# Patient Record
Sex: Female | Born: 1990
Health system: Southern US, Community
[De-identification: ages and names within clinical notes are randomized; demographics above are authoritative.]

---

## 2009-04-26 ENCOUNTER — Emergency Department (HOSPITAL_COMMUNITY): Admission: EM | Admit: 2009-04-26 | Discharge: 2009-04-26 | Payer: Self-pay | Admitting: Emergency Medicine

## 2010-01-30 ENCOUNTER — Emergency Department (HOSPITAL_COMMUNITY): Admission: EM | Admit: 2010-01-30 | Discharge: 2010-01-31 | Payer: Self-pay | Admitting: Emergency Medicine

## 2011-02-24 ENCOUNTER — Emergency Department (HOSPITAL_COMMUNITY)
Admission: EM | Admit: 2011-02-24 | Discharge: 2011-02-25 | Disposition: A | Discharge status: Home / Self Care | Payer: Self-pay | Attending: Emergency Medicine | Admitting: Emergency Medicine

## 2011-02-24 DIAGNOSIS — R21 Rash and other nonspecific skin eruption: Secondary | ICD-10-CM | POA: Insufficient documentation

## 2011-02-24 DIAGNOSIS — R109 Unspecified abdominal pain: Secondary | ICD-10-CM | POA: Insufficient documentation

## 2011-02-25 LAB — DIFFERENTIAL
Eosinophils Absolute: 0.1 10*3/uL (ref 0.0–0.7)
Eosinophils Relative: 2 % (ref 0–5)
Lymphocytes Relative: 31 % (ref 12–46)
Lymphs Abs: 2.3 10*3/uL (ref 0.7–4.0)
Monocytes Relative: 10 % (ref 3–12)

## 2011-02-25 LAB — POCT I-STAT, CHEM 8
BUN: 12 mg/dL (ref 6–23)
Glucose, Bld: 95 mg/dL (ref 70–99)
Hemoglobin: 14.3 g/dL (ref 12.0–15.0)
Potassium: 3.7 mEq/L (ref 3.5–5.1)
Sodium: 141 mEq/L (ref 135–145)
TCO2: 23 mmol/L (ref 0–100)

## 2011-02-25 LAB — CBC
HCT: 40.2 % (ref 36.0–46.0)
Hemoglobin: 13.9 g/dL (ref 12.0–15.0)
MCH: 30 pg (ref 26.0–34.0)
RDW: 12.7 % (ref 11.5–15.5)
WBC: 7.4 10*3/uL (ref 4.0–10.5)

## 2011-02-25 LAB — URINALYSIS, ROUTINE W REFLEX MICROSCOPIC
Glucose, UA: NEGATIVE mg/dL
Ketones, ur: NEGATIVE mg/dL
Protein, ur: NEGATIVE mg/dL
Specific Gravity, Urine: 1.028 (ref 1.005–1.030)

## 2011-02-25 LAB — POCT PREGNANCY, URINE: Preg Test, Ur: NEGATIVE

## 2012-05-03 ENCOUNTER — Ambulatory Visit (INDEPENDENT_AMBULATORY_CARE_PROVIDER_SITE_OTHER): Payer: Self-pay | Admitting: Family Medicine

## 2012-05-03 ENCOUNTER — Encounter: Payer: Self-pay | Admitting: Family Medicine

## 2012-05-03 ENCOUNTER — Telehealth: Payer: Self-pay | Admitting: Family Medicine

## 2012-05-03 VITALS — BP 112/78 | HR 101 | Temp 98.3°F | Ht 68.0 in | Wt 308.0 lb

## 2012-05-03 DIAGNOSIS — L739 Follicular disorder, unspecified: Secondary | ICD-10-CM

## 2012-05-03 DIAGNOSIS — L738 Other specified follicular disorders: Secondary | ICD-10-CM

## 2012-05-03 DIAGNOSIS — Z131 Encounter for screening for diabetes mellitus: Secondary | ICD-10-CM

## 2012-05-03 DIAGNOSIS — Z13220 Encounter for screening for lipoid disorders: Secondary | ICD-10-CM

## 2012-05-03 LAB — LIPID PANEL
LDL Cholesterol: 107 mg/dL — ABNORMAL HIGH (ref 0–99)
Total CHOL/HDL Ratio: 4
Triglycerides: 93 mg/dL (ref 0.0–149.0)
VLDL: 18.6 mg/dL (ref 0.0–40.0)

## 2012-05-03 LAB — HEMOGLOBIN A1C: Hgb A1c MFr Bld: 4.6 % (ref 4.6–6.5)

## 2012-05-03 NOTE — Progress Notes (Signed)
Chief Complaint  Patient presents with  . Establish Care    HPI: Joan Woods is here to establish care.  Has the following concerns today:  Occ boils on thigh: -looks like little pimples  -scars after -has used cream -none right now -has used neosporin  -walking daily, eating healthy - has cut out sodas, candy and sugar - has lost 15 lbs in two months. Considering crossfit. Trying to lose 100lbs.  Other Providers: -none  Flu vaccine - refused today  ROS: See pertinent positives and negatives per HPI.  History reviewed. No pertinent past medical history.  Family History  Problem Relation Age of Onset  . Heart disease Father     valvular disease  . Diabetes      parent  . Diabetes      grandparent     History   Social History  . Marital Status: Single    Spouse Name: N/A    Number of Children: N/A  . Years of Education: N/A   Social History Main Topics  . Smoking status: Never Smoker   . Smokeless tobacco: None  . Alcohol Use: No  . Drug Use: No  . Sexually Active: No     never   Other Topics Concern  . None   Social History Narrative  . None    No current outpatient prescriptions on file.  EXAMCeasar Mons Vitals:   05/03/12 1129  BP: 112/78  Pulse: 101  Temp: 98.3 F (36.8 C)    Body mass index is 46.83 kg/(m^2).  GENERAL: vitals reviewed and listed above, alert, oriented, appears well hydrated and in no acute distress  HEENT: atraumatic, conjunttiva clear, no obvious abnormalities on inspection of external nose and ears  NECK: no obvious masses on inspection  LUNGS: clear to auscultation bilaterally, no wheezes, rales or rhonchi, good air movement  CV: HRRR, no peripheral edema  Skin: few circular scars in inner upper thigh area  MS: moves all extremities without noticeable abnormality  PSYCH: pleasant and cooperative, no obvious depression or anxiety  ASSESSMENT AND PLAN:  Discussed the following assessment and plan:  1.  Screening for diabetes mellitus  Lipid Panel  2. Screening for hyperlipidemia  Hemoglobin A1c  3. Folliculitis     -We reviewed the PMH, PSH, FH, SH, Meds and Allergies. -We provided refills for any medications we will prescribe as needed. -We addressed current concerns per orders and patient instructions. -We have asked for records for pertinent exams, studies, vaccines and notes from previous providers. -encouraged and supported in lifestyle changes. -We have advised patient to follow up per instructions below. -Influenza vaccine refused today  -Patient advised to return or notify a doctor immediately if symptoms worsen or persist or new concerns arise.  Patient Instructions  -We have ordered labs or studies at this visit. It can take up to 1-2 weeks for results and processing. We will contact you with instructions IF your results are abnormal. Normal results will be released to your [facility]. If you have not heard from [place] or can not find your results in [facility] in 2 weeks please contact our office.  -PLEASE SIGN UP FOR MYCHART TODAY   We recommend the following healthy lifestyle measures: - eat a healthy diet consisting of lots of vegetables, fruits, beans, nuts, seeds, healthy meats such as white chicken and fish and whole grains.  - avoid fried foods, fast food, processed foods, sodas, red meet and other fattening foods.  - get a least  150 minutes of aerobic exercise per week.   -benzoyl peroxide 5-10% wash - you can purchase this over the counter  Follow up in: 1 month to discuss focus issues      Joan Woods, Dahlia Client R.

## 2012-05-03 NOTE — Patient Instructions (Signed)
-  We have ordered labs or studies at this visit. It can take up to 1-2 weeks for results and processing. We will contact you with instructions IF your results are abnormal. Normal results will be released to your [facility]. If you have not heard from [place] or can not find your results in [facility] in 2 weeks please contact our office.  -PLEASE SIGN UP FOR MYCHART TODAY   We recommend the following healthy lifestyle measures: - eat a healthy diet consisting of lots of vegetables, fruits, beans, nuts, seeds, healthy meats such as white chicken and fish and whole grains.  - avoid fried foods, fast food, processed foods, sodas, red meet and other fattening foods.  - get a least 150 minutes of aerobic exercise per week.   -benzoyl peroxide 5-10% wash - you can purchase this over the counter  Follow up in: 1 month to discuss focus issues

## 2012-05-03 NOTE — Telephone Encounter (Signed)
Please let patient know, since not yet signed up for mychart, wanted to contact regarding lab results. Recommend signing up for mychart to see these results in about 10 days.  -cholesterol is a bit high -diabetes screening lab negative  The best treatment to hopefully reverse these findings and prevent adverse health outcomes is a healthy diet and regular exercise.

## 2012-05-03 NOTE — Telephone Encounter (Signed)
Called and spoke with pt and pt is aware.  

## 2012-10-20 ENCOUNTER — Encounter (HOSPITAL_COMMUNITY): Payer: Self-pay | Admitting: Emergency Medicine

## 2012-10-20 ENCOUNTER — Emergency Department (HOSPITAL_COMMUNITY)
Admission: EM | Admit: 2012-10-20 | Discharge: 2012-10-20 | Disposition: A | Discharge status: Home / Self Care | Payer: Self-pay | Attending: Emergency Medicine | Admitting: Emergency Medicine

## 2012-10-20 DIAGNOSIS — Y92009 Unspecified place in unspecified non-institutional (private) residence as the place of occurrence of the external cause: Secondary | ICD-10-CM | POA: Insufficient documentation

## 2012-10-20 DIAGNOSIS — Y939 Activity, unspecified: Secondary | ICD-10-CM | POA: Insufficient documentation

## 2012-10-20 DIAGNOSIS — R21 Rash and other nonspecific skin eruption: Secondary | ICD-10-CM | POA: Insufficient documentation

## 2012-10-20 DIAGNOSIS — T63411A Toxic effect of venom of centipedes and venomous millipedes, accidental (unintentional), initial encounter: Secondary | ICD-10-CM | POA: Insufficient documentation

## 2012-10-20 DIAGNOSIS — T6391XA Toxic effect of contact with unspecified venomous animal, accidental (unintentional), initial encounter: Secondary | ICD-10-CM | POA: Insufficient documentation

## 2012-10-20 NOTE — ED Provider Notes (Signed)
Medical screening examination/treatment/procedure(s) were performed by non-physician practitioner and as supervising physician I was immediately available for consultation/collaboration.  Donnetta Hutching, MD 10/20/12 [phone]

## 2012-10-20 NOTE — ED Notes (Signed)
Pt alert, arrives from home, c/o insect bite to right chest, onset was this evening, presents with insect in container, resp even unlabored, skin pwd

## 2012-10-20 NOTE — ED Provider Notes (Signed)
History     CSN: [id number]  Arrival date & time 10/20/12  0402   First MD Initiated Contact with Patient 10/20/12 0425      Chief Complaint  Patient presents with  . Insect Bite    (Consider location/radiation/quality/duration/timing/severity/associated sxs/prior treatment) The history is provided by the patient and medical records. No language interpreter was used.    Joan Woods is a 22 y.o. female  with no known medical history presents to the Emergency Department complaining of acute, gradually resolving pain of the skin of her right upper chest after being bitten by a centipede approximately one hour prior to arrival. Patient states she awoke she felt as if he crawled her chest and when she attempted to pressure way that her. Patient states she has associated redness at the site of the bite and mild pain. She states she treated the bite with alcohol and Betadine. Nothing makes it better and nothing makes it worse. Patient denies fever, chills, headache, neck pain, nausea, vomiting, diarrhea, weakness, dizziness, syncope.  Patient states she is brought live insect with her for [place] to inspect.  History reviewed. No pertinent past medical history.  History reviewed. No pertinent past surgical history.  Family History  Problem Relation Age of Onset  . Heart disease Father     valvular disease  . Diabetes      parent  . Diabetes      grandparent     History  Substance Use Topics  . Smoking status: Never Smoker   . Smokeless tobacco: Not on file  . Alcohol Use: No    OB History   Grav Para Term Preterm Abortions TAB SAB Ect Mult Living                  Review of Systems  Constitutional: Negative for fever and chills.  HENT: Negative for neck pain and neck stiffness.   Eyes: Negative for visual disturbance.  Respiratory: Negative for chest tightness.   Cardiovascular: Negative for chest pain.  Gastrointestinal: Negative for nausea and vomiting.  Skin: Positive for  rash and wound.  Allergic/Immunologic: Negative for immunocompromised state.  Neurological: Negative for headaches.  Hematological: Does not bruise/bleed easily.  Psychiatric/Behavioral: The patient is not nervous/anxious.     Allergies  Review of patient's allergies indicates no known allergies.  Home Medications   Current Outpatient Rx  Name  Route  Sig  Dispense  Refill  . acetaminophen (TYLENOL) 650 MG CR tablet   Oral   Take 650 mg by mouth every 8 (eight) hours as needed for pain (tooth pain).           BP 123/89  Pulse 79  Temp(Src) 98 F (36.7 C)  Resp 16  SpO2 99%  LMP 09/30/2012  Physical Exam  Nursing note and vitals reviewed. Constitutional: She appears well-developed and well-nourished. No distress.  HENT:  Head: Normocephalic and atraumatic.  Mouth/Throat: Oropharynx is clear and moist. No oropharyngeal exudate.  Eyes: Conjunctivae are normal. Pupils are equal, round, and reactive to light. No scleral icterus.  Neck: Normal range of motion. Neck supple.  Cardiovascular: Normal rate, regular rhythm, normal heart sounds and intact distal pulses.   No murmur heard. Pulmonary/Chest: Effort normal and breath sounds normal. No respiratory distress. She has no wheezes.  Musculoskeletal: Normal range of motion. She exhibits no edema.  Lymphadenopathy:    She has no cervical adenopathy.  Neurological: She is alert.  Speech is clear and goal oriented Moves extremities without  ataxia  Skin: Skin is warm and dry. No rash noted. She is not diaphoretic. There is erythema.  Small area of erythema noted to the right upper chest, no specific bite mark noted,  No induration, increased warmth or area of fluctuance.  Psychiatric: She has a normal mood and affect. Her behavior is normal.    ED Course  Procedures (including critical care time)  Labs Reviewed - No data to display No results found.   1. Centipede bite, initial encounter       MDM  Andria Frames  presents with insect bite.  Patient brought live insect with her and is identified as a centipede.  Patient with periodic chest with possible bite, and no specific bite marks noted. No evidence of induration or fluctuance to suggest infection or abscess. Discussed generalized wound care with the patient. Also discussed ibuprofen and/or Tylenol for pain control.  I have also discussed reasons to return immediately to the ER.  Patient expresses understanding and agrees with plan.          Dahlia Client Mikella Linsley, PA-C 10/20/12 [phone]

## 2012-10-20 NOTE — ED Notes (Signed)
Patient is alert and oriented x3.  She was given DC instructions and follow up visit instructions.  Patient gave verbal understanding. She was DC ambulatory under her own power to home.  V/S stable.  He was not showing any signs of distress on DC 

## 2014-08-10 ENCOUNTER — Encounter (HOSPITAL_COMMUNITY): Payer: Self-pay | Admitting: *Deleted

## 2014-08-10 ENCOUNTER — Emergency Department (HOSPITAL_COMMUNITY): Payer: 59

## 2014-08-10 ENCOUNTER — Emergency Department (HOSPITAL_COMMUNITY)
Admission: EM | Admit: 2014-08-10 | Discharge: 2014-08-10 | Disposition: A | Discharge status: Home / Self Care | Payer: 59 | Attending: Emergency Medicine | Admitting: Emergency Medicine

## 2014-08-10 DIAGNOSIS — Y9389 Activity, other specified: Secondary | ICD-10-CM | POA: Diagnosis not present

## 2014-08-10 DIAGNOSIS — Y92410 Unspecified street and highway as the place of occurrence of the external cause: Secondary | ICD-10-CM | POA: Diagnosis not present

## 2014-08-10 DIAGNOSIS — Y998 Other external cause status: Secondary | ICD-10-CM | POA: Insufficient documentation

## 2014-08-10 DIAGNOSIS — S8992XA Unspecified injury of left lower leg, initial encounter: Secondary | ICD-10-CM | POA: Insufficient documentation

## 2014-08-10 DIAGNOSIS — M25562 Pain in left knee: Secondary | ICD-10-CM

## 2014-08-10 MED ORDER — NAPROXEN 500 MG PO TABS
500.0000 mg | ORAL_TABLET | Freq: Once | ORAL | Status: AC
  Administered 2014-08-10: 500 mg via ORAL
  Filled 2014-08-10: qty 1

## 2014-08-10 MED ORDER — HYDROCODONE-ACETAMINOPHEN 5-325 MG PO TABS: 1.0000 | ORAL_TABLET | ORAL | Status: AC | PRN

## 2014-08-10 MED ORDER — HYDROCODONE-ACETAMINOPHEN 5-325 MG PO TABS
1.0000 | ORAL_TABLET | Freq: Once | ORAL | Status: AC
  Administered 2014-08-10: 1 via ORAL
  Filled 2014-08-10: qty 1

## 2014-08-10 MED ORDER — NAPROXEN 500 MG PO TABS: 500.0000 mg | ORAL_TABLET | Freq: Two times a day (BID) | ORAL | Status: AC
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
## 2014-08-10 NOTE — ED Notes (Signed)
Bed: [id number] Expected date:  Expected time:  Means of arrival:  Comments: EMS 24 yo female sore left knee/MSK pain generalized

## 2014-08-10 NOTE — Discharge Instructions (Signed)
Please follow the directions provided. Be sure to follow-up with your primary care doctor to ensure you're getting better. Please take the naproxen twice a day to help with pain. You may take the Vicodin for pain not relieved by the naproxen. Don't hesitate to return for any new, worsening, or concerning symptoms.  SEEK IMMEDIATE MEDICAL CARE IF:  You have numbness, tingling, or weakness in the arms or legs.  You develop severe headaches not relieved with medicine.  You have severe neck pain, especially tenderness in the middle of the back of your neck.  You have changes in bowel or bladder control.  There is increasing pain in any area of the body.  You have shortness of breath, light-headedness, dizziness, or fainting.  You have chest pain.  You feel sick to your stomach (nauseous), throw up (vomit), or sweat.  You have increasing abdominal discomfort.  There is blood in your urine, stool, or vomit.  You have pain in your shoulder (shoulder strap areas).  You feel your symptoms are getting worse.
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
## 2015-04-28 ENCOUNTER — Ambulatory Visit: Payer: 59 | Attending: Orthopedic Surgery | Admitting: Rehabilitation

## 2015-04-28 ENCOUNTER — Encounter: Payer: Self-pay | Admitting: Rehabilitation

## 2015-04-28 DIAGNOSIS — G8929 Other chronic pain: Secondary | ICD-10-CM | POA: Diagnosis present

## 2015-04-28 DIAGNOSIS — M545 Low back pain, unspecified: Secondary | ICD-10-CM

## 2015-04-28 NOTE — Patient Instructions (Signed)
Proper box lift technique; to avoid lifting the boxes at work for a short time period if possible HEP:
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
## 2015-05-05 ENCOUNTER — Ambulatory Visit: Payer: 59 | Attending: Orthopedic Surgery | Admitting: Physical Therapy

## 2015-05-05 DIAGNOSIS — G8929 Other chronic pain: Secondary | ICD-10-CM | POA: Diagnosis present

## 2015-05-05 DIAGNOSIS — M545 Low back pain, unspecified: Secondary | ICD-10-CM

## 2015-05-05 NOTE — Therapy (Signed)
[facility] [address] [place], [place], [address] Phone: [phone]   Fax:  [phone]  Physical Therapy Treatment  Patient Details  Name: Season Astacio MRN: [id number] Date of Birth: Dec 01, 1990 Referring Provider: Aldean Baker  Encounter Date: 05/05/2015      PT End of Session - 05/05/15 0833    Visit Number 2   Number of Visits 12   Date for PT Re-Evaluation 06/09/15   PT Start Time 0755   PT Stop Time 0850   PT Time Calculation (min) 55 min   Activity Tolerance Patient tolerated treatment well      History reviewed. No pertinent past medical history.  History reviewed. No pertinent past surgical history.  There were no vitals filed for this visit.  Visit Diagnosis:  Chronic bilateral low back pain without sciatica      Subjective Assessment - 05/05/15 0758    Subjective Back was a little sore after eval and on Sunday after work. Feels better today. Has not been taking any medicine or using ice or heat at home, has been doing HEP   Currently in Pain? No/denies                         [facility] Adult PT Treatment/Exercise - 05/05/15 0001    Exercises   Exercises Lumbar   Lumbar Exercises: Aerobic   Stationary Bike 5 mins level 3   Lumbar Exercises: Supine   Ab Set 10 reps;3 seconds  tactile cues for activation   Bridge 20 reps;3 seconds  then with add squeeze, 20x   Other Supine Lumbar Exercises lower trunk rotation x 10   Other Supine Lumbar Exercises ab set with ball squeeze 10 x 3 sec   Lumbar Exercises: Prone   Single Arm Raise Right;Left;10 reps   Other Prone Lumbar Exercises press up to elbows x 3   Moist Heat Therapy   Number Minutes Moist Heat 15 Minutes   Moist Heat Location Lumbar Spine   Electrical Stimulation   Electrical Stimulation Location low back   Electrical Stimulation Action IFC   Electrical Stimulation Goals Pain   Manual Therapy   Manual Therapy Joint  mobilization;Soft tissue mobilization   Joint Mobilization grade 1-2 CPAs L2-5   Soft tissue mobilization STM lumbar paraspinals                PT Education - 05/05/15 0833    Education provided Yes   Education Details added adductor squeeze to bridge and ab set   Person(s) Educated Patient   Methods Explanation;Demonstration   Comprehension Returned demonstration;Verbalized understanding          PT Short Term Goals - 05/05/15 0835    PT SHORT TERM GOAL #1   Title pt will be independent with initial HEP   Time 1   Period Weeks   Status On-going           PT Long Term Goals - 05/05/15 9528    PT LONG TERM GOAL #1   Title Pt will perform lumbar extension painfree   Time 6   Period Weeks   Status On-going   PT LONG TERM GOAL #2   Title pt will demonstrate proper lifting technique with 30# box   Time 6   Period Weeks   Status On-going   PT LONG TERM GOAL #3   Title pt will return to baseline of no lumbar pain at rest  Time 6   Period Weeks   Status On-going               Plan - 05/05/15 [id number]    Clinical Impression Statement Pt with no back pain today, pt states it usually gets worse later in the day. Pt with difficulty adding adductor squeeze to exercises, this is a good challenge.  Pt will continue to benefit from skilled PT to increase strength and decrease pain   Pt will benefit from skilled therapeutic intervention in order to improve on the following deficits Decreased activity tolerance;Decreased range of motion;Decreased strength;Improper body mechanics;Pain   Rehab Potential Excellent   PT Frequency 2x / week   PT Duration 6 weeks   PT Treatment/Interventions Electrical Stimulation;Moist Heat;Traction;Functional mobility training;Therapeutic exercise;Neuromuscular re-education;Patient/family education;Manual techniques   PT Next Visit Plan progress core strength into functional tasks, continue manual and modalities   Consulted and Agree  with Plan of Care Patient        Problem List There are no active problems to display for this patient.   Reggy EyeKaren Aerabella Galasso, PT, DPT  05/05/2015, 8:36 AM   [facility] [address], [address] [place], [place], [address] Phone: [phone]   Fax:  [phone]  Name: Andria Framesudrey Langille MRN: [id number] Date of Birth: 05/09/1991

## 2015-05-12 ENCOUNTER — Ambulatory Visit: Payer: 59

## 2015-05-12 DIAGNOSIS — M545 Low back pain: Secondary | ICD-10-CM | POA: Diagnosis not present

## 2015-05-12 DIAGNOSIS — G8929 Other chronic pain: Secondary | ICD-10-CM

## 2015-05-12 NOTE — Therapy (Signed)
[facility] [address], [address] [place], [place], [address] Phone: [phone]   Fax:  [phone]  Physical Therapy Treatment  Patient Details  Name: Shama Monfils MRN: [id number] Date of Birth: 18-Aug-1990 Referring Provider: Aldean Baker  Encounter Date: 05/12/2015      PT End of Session - 05/12/15 0842    Visit Number 3   Date for PT Re-Evaluation 06/09/15   PT Start Time 0804   PT Stop Time 0842   PT Time Calculation (min) 38 min   Activity Tolerance Patient tolerated treatment well   Behavior During Therapy [facility] for tasks assessed/performed      History reviewed. No pertinent past medical history.  History reviewed. No pertinent past surgical history.  There were no vitals filed for this visit.  Visit Diagnosis:  Chronic bilateral low back pain without sciatica      Subjective Assessment - 05/12/15 0812    Subjective Pt reports no pain today.  Sore during work on Sunday when truck came in and had to unload.     Currently in Pain? No/denies                         [facility] Adult PT Treatment/Exercise - 05/12/15 0001    Therapeutic Activites    Therapeutic Activities Lifting   Lifting 20# box x10  verbal and demo cues for correct technique   Lumbar Exercises: Stretches   Active Hamstring Stretch 3 reps;20 seconds  seated and supine with strap   Single Knee to Chest Stretch 3 reps;20 seconds   Lower Trunk Rotation 20 seconds;3 reps   Lumbar Exercises: Aerobic   Stationary Bike 6 mins level 3   Lumbar Exercises: Supine   Bridge 20 reps;3 seconds  then with add squeeze, 20x   Lumbar Exercises: Prone   Straight Leg Raise 20 reps   Other Prone Lumbar Exercises press up to elbows x 3                PT Education - 05/12/15 0830    Education provided Yes   Education Details seated hamstring stretch   Person(s) Educated Patient   Methods Explanation;Demonstration   Comprehension  Verbalized understanding;Returned demonstration          PT Short Term Goals - 05/12/15 0814    PT SHORT TERM GOAL #1   Title pt will be independent with initial HEP   Status Achieved           PT Long Term Goals - 05/12/15 0814    PT LONG TERM GOAL #1   Title Pt will perform lumbar extension painfree   Time 6   Period Weeks   Status On-going   PT LONG TERM GOAL #3   Title pt will return to baseline of no lumbar pain at rest   Time 6   Period Weeks   Status On-going  60% better               Plan - 05/12/15 0819    Clinical Impression Statement Pt reports 60% overall improvement in lumbar pain since the start of care.  Pt with only mild pain with extension now. Pt with core weakness and demonstrates difficulty with briding with ball squeeze today.  No pain today and reports soreness with lifting at work. Pt will benefit from skilled PT for core strength and flexibility.     Pt will benefit from skilled therapeutic intervention in order  to improve on the following deficits Decreased activity tolerance;Decreased range of motion;Decreased strength;Improper body mechanics;Pain   Rehab Potential Excellent   PT Frequency 2x / week   PT Duration 6 weeks   PT Treatment/Interventions Electrical Stimulation;Moist Heat;Traction;Functional mobility training;Therapeutic exercise;Neuromuscular re-education;Patient/family education;Manual techniques   PT Next Visit Plan progress core strength into functional tasks, continue manual and modalities as needed.  Practice lifting again and issue handout regarding lifting and body mechanics   Consulted and Agree with Plan of Care Patient        Problem List There are no active problems to display for this patient.   Shahana Capes, PT 05/12/2015, 8:43 AM  [facility] [address], [address] [place], [place], [address] Phone: [phone]   Fax:  [phone]  Name: Andria Framesudrey  Brenner MRN: [id number] Date of Birth: 07/02/1991

## 2015-05-12 NOTE — Patient Instructions (Signed)
HIP: Hamstrings - Short Sitting    Rest leg on raised surface. Keep knee straight. Lift chest. Hold _20__ seconds. _3__ reps per set, __3_ sets per day  Copyright  VHI. All rights reserved.  [facility] [address], [address] [place], [place] [address] Phone # [phone] Fax [phone] 

## 2015-05-15 ENCOUNTER — Encounter: Payer: 59 | Admitting: Physical Therapy

## 2015-05-19 ENCOUNTER — Ambulatory Visit: Payer: 59 | Admitting: Physical Therapy

## 2015-05-19 DIAGNOSIS — G8929 Other chronic pain: Secondary | ICD-10-CM

## 2015-05-19 DIAGNOSIS — M545 Low back pain: Secondary | ICD-10-CM | POA: Diagnosis not present

## 2015-05-19 NOTE — Therapy (Signed)
[facility] [address] [place], [place], [address] Phone: [phone]   Fax:  [phone]  Physical Therapy Treatment  Patient Details  Name: Joan Woods MRN: [id number] Date of Birth: 1990-12-17 Referring Provider: Aldean Baker  Encounter Date: 05/19/2015      PT End of Session - 05/19/15 0918    Visit Number 4   Number of Visits 12   Date for PT Re-Evaluation 06/09/15   PT Start Time 0845   PT Stop Time 0925   PT Time Calculation (min) 40 min   Activity Tolerance Patient tolerated treatment well   Behavior During Therapy [facility] for tasks assessed/performed      History reviewed. No pertinent past medical history.  History reviewed. No pertinent past surgical history.  There were no vitals filed for this visit.  Visit Diagnosis:  Chronic bilateral low back pain without sciatica      Subjective Assessment - 05/19/15 0848    Subjective Pt reports no pain today, tried a foam roller on her back yesterday and it hurt. 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 No/denies                         [facility] Adult PT Treatment/Exercise - 05/19/15 0001    Therapeutic Activites    Therapeutic Activities Lifting   Lifting 20# box knees to elbow height x 10  cues for technique   Work Simulation walk with 20' box 2 x 40'  cues for ab set during work   Lumbar Exercises: Programme researcher, broadcasting/film/video 2 reps;30 seconds  with strap   Single Knee to Chest Stretch 2 reps;30 seconds   Lumbar Exercises: Aerobic   Stationary Bike 6 mins level 3   Lumbar Exercises: Supine   Bridge 20 reps;3 seconds  then x 20 with add sqz   [employer] Abdominal Isometric --  seated on ball march and LAQ x 20 each   Other Supine Lumbar Exercises lower trunk rotation with add squeeze x 20   Other Supine Lumbar Exercises bilat knees to chest 2x 10,  cues for breathing    Lumbar Exercises: Prone   Straight Leg Raise 20 reps   Opposite Arm/Leg Raise 20 reps                PT Education - 05/19/15 [phone]    Education provided Yes   Education Details lifting technique   Person(s) Educated Patient   Methods Handout;Explanation;Demonstration   Comprehension Verbalized understanding;Returned demonstration          PT Short Term Goals - 05/19/15 0925    PT SHORT TERM GOAL #1   Title pt will be independent with initial HEP   Status Achieved           PT Long Term Goals - 05/19/15 0925    PT LONG TERM GOAL #1   Title Pt will perform lumbar extension painfree   Time 6   Period Weeks   Status On-going   PT LONG TERM GOAL #2   Title pt will demonstrate proper lifting technique with 30# box   Time 6   Period Weeks   Status On-going   PT LONG TERM GOAL #3   Title pt will return to baseline of no lumbar pain at rest   Time 6   Period Weeks   Status On-going  Plan - 05/19/15 0920    Clinical Impression Statement Pt says work has been going better, she can tell a difference when using proper lifting techniques. Pt continues with weak abdominals, difficulty with LAQ on ball and double knee to chest.  Pt will continue to benefit form skilled PT for core strength and training.   Pt will benefit from skilled therapeutic intervention in order to improve on the following deficits Decreased activity tolerance;Decreased range of motion;Decreased strength;Improper body mechanics;Pain   PT Frequency 2x / week   PT Duration 6 weeks   PT Treatment/Interventions Electrical Stimulation;Moist Heat;Traction;Functional mobility training;Therapeutic exercise;Neuromuscular re-education;Patient/family education;Manual techniques   PT Next Visit Plan progress core strength and work simulation   Consulted and Agree with Plan of Care Patient        Problem List There are no active problems to display for this patient.  Reggy EyeKaren  Donawerth, PT, DPT  05/19/2015, 9:26 AM  [place] [address], [address] [place], [place], [address] Phone: [phone]   Fax:  [phone]  Name: Joan Woods MRN: [id number] Date of Birth: 09/22/1990

## 2015-05-22 ENCOUNTER — Ambulatory Visit: Payer: 59 | Admitting: Physical Therapy

## 2015-05-22 ENCOUNTER — Encounter: Payer: Self-pay | Admitting: Physical Therapy

## 2015-05-22 DIAGNOSIS — M545 Low back pain: Secondary | ICD-10-CM | POA: Diagnosis not present

## 2015-05-22 DIAGNOSIS — G8929 Other chronic pain: Secondary | ICD-10-CM

## 2015-05-22 NOTE — Therapy (Signed)
[facility] [address], [address] [place], [place], [address] Phone: [phone]   Fax:  [phone]  Physical Therapy Treatment  Patient Details  Name: Joan Woods MRN: [id number] Date of Birth: 09/09/1990 Referring Provider: Aldean BakerMarcus Duda  Encounter Date: 05/22/2015      PT End of Session - 05/22/15 0816    Visit Number 5   Number of Visits 12   Date for PT Re-Evaluation 06/09/15   PT Start Time 0808   PT Stop Time 0853   PT Time Calculation (min) 45 min   Activity Tolerance Patient tolerated treatment well   Behavior During Therapy [facility] for tasks assessed/performed      History reviewed. No pertinent past medical history.  History reviewed. No pertinent past surgical history.  There were no vitals filed for this visit.  Visit Diagnosis:  Chronic bilateral low back pain without sciatica      Subjective Assessment - 05/22/15 0811    Subjective My legs were super sore after last session. My back is better, after work it is not as sore as it usually is.    Currently in Pain? Yes   Pain Score 5    Pain Location Back   Pain Orientation Right;Lower   Pain Descriptors / Indicators Sore;Tightness   Aggravating Factors  work duties   Pain Relieving Factors rest   Multiple Pain Sites No                         [facility] Adult PT Treatment/Exercise - 05/22/15 0001    Therapeutic Activites    Therapeutic Activities Lifting   Lifting 30# 2x 10 knee to chest with a few side steps   Work Counselling psychologistimulation 30# around gym 3x    Lumbar Exercises: Programme researcher, broadcasting/film/videotretches   Active Hamstring Stretch 2 reps;30 seconds  seated   Single Knee to Chest Stretch 3 reps;20 seconds   Lumbar Exercises: Aerobic   Stationary Bike Nustep  L2 x 10 min   UBE (Upper Arm Bike) Sitting on green ball L2 3x3   Lumbar Exercises: Standing   Other Standing Lumbar Exercises resisted walking 10x fwd/bkwd 25#   Lumbar Exercises: Seated   Sit to Stand --   Sitting on green ball: Marching 3 x 10 with focus on posture   Lumbar Exercises: Supine   Bridge --  2x 20 with ball squeeze.                  PT Short Term Goals - 05/19/15 0925    PT SHORT TERM GOAL #1   Title pt will be independent with initial HEP   Status Achieved           PT Long Term Goals - 05/22/15 0815    PT LONG TERM GOAL #3   Title pt will return to baseline of no lumbar pain at rest   Time 6   Period Weeks   Status Achieved               Plan - 05/22/15 [id number]    Clinical Impression Statement Pt reports she feels 50% improved including increased knowledge regarding lifting technique at work. She reports her biggest improvement is after work her back does not feel as sore. Increased her lifting weight today to 25# appraoching goal of 30#.    Pt will benefit from skilled therapeutic intervention in order to improve on the following deficits Decreased activity tolerance;Decreased range of motion;Decreased strength;Improper  body mechanics;Pain   Rehab Potential Excellent   PT Frequency 2x / week   PT Duration 6 weeks   PT Treatment/Interventions Electrical Stimulation;Moist Heat;Traction;Functional mobility training;Therapeutic exercise;Neuromuscular re-education;Patient/family education;Manual techniques   PT Next Visit Plan progress core strength and work simulation   Consulted and Agree with Plan of Care Patient        Problem List There are no active problems to display for this patient.   Tapanga Ottaway, PTA 05/22/2015, 8:46 AM  [facility] [address] [place], [place], [address] Phone: [phone]   Fax:  [phone]  Name: Joan Woods MRN: [id number] Date of Birth: 11/16/1990

## 2015-05-26 ENCOUNTER — Encounter: Payer: Self-pay | Admitting: Physical Therapy

## 2015-05-26 ENCOUNTER — Ambulatory Visit: Payer: 59 | Admitting: Physical Therapy

## 2015-05-26 DIAGNOSIS — M545 Low back pain, unspecified: Secondary | ICD-10-CM

## 2015-05-26 DIAGNOSIS — G8929 Other chronic pain: Secondary | ICD-10-CM

## 2015-05-26 NOTE — Therapy (Signed)
[facility] [address], [address] [place], [place], [address] Phone: [phone]   Fax:  [phone]  Physical Therapy Treatment  Patient Details  Name: Joan Woods MRN: [id number] Date of Birth: [birth year] Referring Provider: Aldean Baker  Encounter Date: 05/26/2015      PT End of Session - 05/26/15 0811    Visit Number 6   Number of Visits 12   Date for PT Re-Evaluation 06/09/15   PT Start Time 0803   PT Stop Time 0852   PT Time Calculation (min) 49 min   Activity Tolerance Patient tolerated treatment well   Behavior During Therapy [facility] for tasks assessed/performed      History reviewed. No pertinent past medical history.  History reviewed. No pertinent past surgical history.  There were no vitals filed for this visit.  Visit Diagnosis:  Chronic bilateral low back pain without sciatica      Subjective Assessment - 05/26/15 0808    Subjective Pt reports feeling 50% improvement, the lifting and bending is still challenging.    Currently in Pain? No/denies  only stiffness, no pain                         [facility] Adult PT Treatment/Exercise - 05/26/15 0001    Therapeutic Activites    Therapeutic Activities Lifting   Lifting 32# x 6, incr weight 40# 2x10 chair to mat table,   Work Simulation 40# two 2 around gym =160 feet pt with good demo   Lumbar Exercises: Stretches   Active Hamstring Stretch 2 reps;30 seconds  seated   Single Knee to Chest Stretch 3 reps;20 seconds   Standing Extension Other (comment)  [place] extension x10, pt advised to perform at work   Lumbar Exercises: Aerobic   Stationary Bike L3 x   UBE (Upper Arm Bike) Sitting on green ball L3 (3/3)   Lumbar Exercises: Standing   Other Standing Lumbar Exercises resisted walking  35# forward/reverse, backward/reverse 10 each    Lumbar Exercises: Prone   Straight Leg Raise 20 reps   Opposite Arm/Leg Raise 20 reps   Other Prone Lumbar Exercises Plank position                  PT Short Term Goals - 05/19/15 0925    PT SHORT TERM GOAL #1   Title pt will be independent with initial HEP   Status Achieved           PT Long Term Goals - 05/26/15 0816    PT LONG TERM GOAL #1   Title Pt will perform lumbar extension painfree   Time 6   Period Weeks   Status On-going   PT LONG TERM GOAL #2   Title pt will demonstrate proper lifting technique with 30# box   Time 6   Period Weeks   Status On-going   PT LONG TERM GOAL #3   Title pt will return to baseline of no lumbar pain at rest   Period Weeks   Status Achieved               Plan - 05/26/15 6962    Clinical Impression Statement Pt with good performance with activities in gym able to incr weight with resisted wlaking.    Pt will benefit from skilled therapeutic intervention in order to improve on the following deficits Decreased activity tolerance;Decreased range of motion;Decreased strength;Improper body mechanics;Pain   Rehab Potential  Excellent   PT Frequency 2x / week   PT Duration 6 weeks   PT Treatment/Interventions Electrical Stimulation;Moist Heat;Traction;Functional mobility training;Therapeutic exercise;Neuromuscular re-education;Patient/family education;Manual techniques   PT Next Visit Plan progress core strength and work simulation   Consulted and Agree with Plan of Care Patient        Problem List There are no active problems to display for this patient.   NAUMANN-HOUEGNIFIO,Pius Byrom PTA 05/26/2015, 8:48 AM  [facility] [address], [address] [place], [place], [address] Phone: [phone]   Fax:  [phone]  Name: Joan Woods MRN: [id number] Date of Birth: 02/13/1991

## 2015-06-02 ENCOUNTER — Ambulatory Visit: Payer: 59 | Admitting: Physical Therapy

## 2015-06-02 DIAGNOSIS — M545 Low back pain: Secondary | ICD-10-CM | POA: Diagnosis not present

## 2015-06-02 DIAGNOSIS — G8929 Other chronic pain: Secondary | ICD-10-CM

## 2015-06-02 NOTE — Therapy (Signed)
[facility] [address] [place], [place], [address] Phone: [phone]   Fax:  [phone]  Physical Therapy Treatment  Patient Details  Name: Joan Woods MRN: [id number] Date of Birth: [birth year] Referring Provider: Aldean Baker  Encounter Date: 06/02/2015      PT End of Session - 06/02/15 0834    Visit Number 7   Number of Visits 12   Date for PT Re-Evaluation 06/09/15   PT Start Time 0802   PT Stop Time 0842   PT Time Calculation (min) 40 min   Activity Tolerance Patient tolerated treatment well   Behavior During Therapy [facility] for tasks assessed/performed      History reviewed. No pertinent past medical history.  History reviewed. No pertinent past surgical history.  There were no vitals filed for this visit.  Visit Diagnosis:  Chronic bilateral low back pain without sciatica      Subjective Assessment - 06/02/15 0805    Subjective Saturday was rough at work, lots of lifting, hasn't been back to work since then   Currently in Pain? No/denies                         [facility] Adult PT Treatment/Exercise - 06/02/15 0001    Therapeutic Activites    Lifting 40# chair to mat table, then 20# chair to overhead x 10   Work Simulation walk with 40# 160'   Lumbar Exercises: Stretches   Active Hamstring Stretch 2 reps;30 seconds  seated   Single Knee to Chest Stretch 2 reps;30 seconds   Standing Extension --  10 x   Lumbar Exercises: Aerobic   Stationary Bike L3 x 6 mins   UBE (Upper Arm Bike) sitting on green ball, L3 x 6 mins (3/3)   Lumbar Exercises: Standing   Other Standing Lumbar Exercises resisted walking 35# 10x all directions   Lumbar Exercises: Prone   Opposite Arm/Leg Raise 20 reps                  PT Short Term Goals - 06/02/15 3244    PT SHORT TERM GOAL #1   Title pt will be independent with initial HEP   Status Achieved           PT Long Term Goals -  06/02/15 0836    PT LONG TERM GOAL #1   Title Pt will perform lumbar extension painfree   Time 6   Period Weeks   Status On-going   PT LONG TERM GOAL #2   Title pt will demonstrate proper lifting technique with 30# box   Time 6   Period Weeks   Status Achieved   PT LONG TERM GOAL #3   Title pt will return to baseline of no lumbar pain at rest   Time 6   Period Weeks   Status Achieved               Plan - 06/02/15 0834    Clinical Impression Statement Pt with improved body mechanics with lifting and carrying tasks, still requires cues for posture when fatigued.   Pt will benefit from skilled therapeutic intervention in order to improve on the following deficits Decreased activity tolerance;Decreased range of motion;Decreased strength;Improper body mechanics;Pain   PT Frequency 2x / week   PT Duration 6 weeks   PT Treatment/Interventions Electrical Stimulation;Moist Heat;Traction;Functional mobility training;Therapeutic exercise;Neuromuscular re-education;Patient/family education;Manual techniques   PT Next Visit Plan continue to progress work  simulation, provide HEP so pt will feel comfortable by d/c   Consulted and Agree with Plan of Care Patient        Problem List There are no active problems to display for this patient.   Reggy EyeKaren Devonda Pequignot, PT, DPT  06/02/2015, 8:40 AM  [facility] [address], [address] [place], [place], [address] Phone: [phone]   Fax:  [phone]  Name: Joan Woods MRN: [id number] Date of Birth: 01/23/1991

## 2015-06-05 ENCOUNTER — Ambulatory Visit: Payer: 59 | Attending: Orthopedic Surgery | Admitting: Physical Therapy

## 2015-06-05 ENCOUNTER — Encounter: Payer: Self-pay | Admitting: Physical Therapy

## 2015-06-05 DIAGNOSIS — G8929 Other chronic pain: Secondary | ICD-10-CM | POA: Diagnosis present

## 2015-06-05 DIAGNOSIS — M545 Low back pain, unspecified: Secondary | ICD-10-CM

## 2015-06-05 NOTE — Therapy (Addendum)
[facility] [address], [place], [place], [address] Phone: [phone]   Fax:  [phone]  Physical Therapy Treatment  Patient Details  Name: Joan Woods MRN: [id number] Date of Birth: 1991/05/29 Referring Provider: Dr. Meridee Score  Encounter Date: 06/05/2015      PT End of Session - 06/05/15 0807    Visit Number 8   Number of Visits 12   Date for PT Re-Evaluation 06/09/15   PT Start Time 0805   PT Stop Time 0845   PT Time Calculation (min) 40 min   Activity Tolerance Patient tolerated treatment well   Behavior During Therapy [facility] for tasks assessed/performed      History reviewed. No pertinent past medical history.  History reviewed. No pertinent past surgical history.  There were no vitals filed for this visit.  Visit Diagnosis:  Chronic bilateral low back pain without sciatica      Subjective Assessment - 06/05/15 0805    Subjective Some tingles/numbess remains in her toes. No back pain. Reports she will not be here for ERO on Tuesday, is going out of town so she would like today to be her last day.    Currently in Pain? No/denies   Multiple Pain Sites No            [facility] PT Assessment - 06/05/15 0001    Assessment   Medical Diagnosis lumbar spine sprain   Referring Provider Dr. Meridee Score   Onset Date/Surgical Date 08/10/14   Prior Therapy no   Precautions   Precautions None   Restrictions   Weight Bearing Restrictions No   Balance Screen   Has the patient fallen in the past 6 months No   Has the patient had a decrease in activity level because of a fear of falling?  No   Is the patient reluctant to leave their home because of a fear of falling?  No   Observation/Other Assessments   Focus on Therapeutic Outcomes (FOTO)  39% CJ   AROM   Lumbar Extension WNL and no pain                     [facility] Adult PT Treatment/Exercise - 06/05/15 0001    Lumbar Exercises: Stretches    Active Hamstring Stretch 3 reps;30 seconds   Single Knee to Chest Stretch 3 reps;30 seconds   Lumbar Exercises: Aerobic   Stationary Bike L3 x10 min   UBE (Upper Arm Bike) sitting on green ball, L3 x 6 mins (3/3)                  PT Short Term Goals - 06/02/15 0835    PT SHORT TERM GOAL #1   Title pt will be independent with initial HEP   Status Achieved           PT Long Term Goals - 06/05/15 0839    PT LONG TERM GOAL #1   Title Pt will perform lumbar extension painfree   Time 6   Period Weeks   Status Achieved               Plan - 06/05/15 6761    Clinical Impression Statement Pt reports the therapy has helped 80%-90%. As long as she does her stretches she does not hurt. She is planning on continuing a walking program and looking at reducing her sugar intake upon discharge today. She has met all STGs.    Pt will benefit from  skilled therapeutic intervention in order to improve on the following deficits Decreased activity tolerance;Decreased range of motion;Decreased strength;Improper body mechanics;Pain   Rehab Potential Excellent   PT Frequency 2x / week   PT Duration 6 weeks   PT Treatment/Interventions Electrical Stimulation;Moist Heat;Traction;Functional mobility training;Therapeutic exercise;Neuromuscular re-education;Patient/family education;Manual techniques   PT Next Visit Plan DC   Consulted and Agree with Plan of Care Patient        Problem List There are no active problems to display for this patient.   GRAY,CHERYL, PTA 06/05/2015, 10:09 AM  [facility] [address] [place], [place], [address] Phone: [phone]   Fax:  [phone]  Name: Joan Woods MRN: [id number] Date of Birth: 24-Nov-1990    PHYSICAL THERAPY DISCHARGE SUMMARY  Visits from Start of Care: 8  Current functional level related to goals / functional outcomes: See above.    Remaining  deficits: See above.    Education / Equipment: HEP Plan: Patient agrees to discharge.  Patient goals were met. Patient is being discharged due to meeting the stated rehab goals.  Thank you for the referral. Earlie Counts, PT 06/05/2015 10:08 AM  ?????

## 2015-06-09 ENCOUNTER — Ambulatory Visit: Payer: 59
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
## 2019-09-12 ENCOUNTER — Ambulatory Visit: Payer: BC Managed Care – PPO | Attending: Internal Medicine

## 2019-09-12 DIAGNOSIS — Z23 Encounter for immunization: Secondary | ICD-10-CM

## 2019-09-12 NOTE — Progress Notes (Signed)
   Covid-19 Vaccination Clinic  Name:  Joan Woods    MRN: [id number] DOB: 1991-01-05  09/12/2019  [name] was observed post Covid-19 immunization for 15 minutes without incident. She was provided with Vaccine Information Sheet and instruction to access the V-Safe system.   [name] was instructed to call 911 with any severe reactions post vaccine: Marland Kitchen Difficulty breathing  . Swelling of face and throat  . A fast heartbeat  . A bad rash all over body  . Dizziness and weakness   Immunizations Administered    Name Date Dose VIS Date Route   Pfizer COVID-19 Vaccine 09/12/2019  3:11 PM 0.3 mL 06/14/2019 Intramuscular   Manufacturer: [employer], [employer]   Lot: [id number]   NDC: [address]-1

## 2019-10-07 ENCOUNTER — Ambulatory Visit: Payer: BC Managed Care – PPO | Attending: Internal Medicine

## 2019-10-07 DIAGNOSIS — Z23 Encounter for immunization: Secondary | ICD-10-CM

## 2019-10-07 NOTE — Progress Notes (Signed)
   Covid-19 Vaccination Clinic  Name:  Dalaney Needle    MRN: [id number] DOB: [birth year]  10/07/2019  Ms. Sayler was observed post Covid-19 immunization for 15 minutes without incident. She was provided with Vaccine Information Sheet and instruction to access the V-Safe system.   Ms. Aguinaldo was instructed to call 911 with any severe reactions post vaccine: Marland Kitchen Difficulty breathing  . Swelling of face and throat  . A fast heartbeat  . A bad rash all over body  . Dizziness and weakness   Immunizations Administered    Name Date Dose VIS Date Route   Pfizer COVID-19 Vaccine 10/07/2019  3:37 PM 0.3 mL 06/14/2019 Intramuscular   Manufacturer: [employer], [employer]   Lot: [id number]   NDC: [address]-6

## 2020-02-28 ENCOUNTER — Other Ambulatory Visit: Payer: Self-pay
